# Patient Record
Sex: Male | Born: 1985 | Race: White | Hispanic: No | Marital: Married | Smoking: Former smoker
Health system: Southern US, Community
[De-identification: ages and names within clinical notes are randomized; demographics above are authoritative.]

---
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
## 2009-05-25 ENCOUNTER — Emergency Department (HOSPITAL_COMMUNITY): Admission: EM | Admit: 2009-05-25 | Discharge: 2009-05-25 | Payer: Self-pay | Admitting: Emergency Medicine

## 2013-12-23 ENCOUNTER — Emergency Department (INDEPENDENT_AMBULATORY_CARE_PROVIDER_SITE_OTHER)
Admission: EM | Admit: 2013-12-23 | Discharge: 2013-12-23 | Disposition: A | Discharge status: Home / Self Care | Payer: Self-pay | Source: Home / Self Care | Attending: Emergency Medicine | Admitting: Emergency Medicine

## 2013-12-23 ENCOUNTER — Encounter (HOSPITAL_COMMUNITY): Payer: Self-pay | Admitting: Emergency Medicine

## 2013-12-23 DIAGNOSIS — T148 Other injury of unspecified body region: Secondary | ICD-10-CM

## 2013-12-23 DIAGNOSIS — Z23 Encounter for immunization: Secondary | ICD-10-CM

## 2013-12-23 DIAGNOSIS — T148XXA Other injury of unspecified body region, initial encounter: Secondary | ICD-10-CM

## 2013-12-23 MED ORDER — LIDOCAINE HCL (PF) 2 % IJ SOLN
INTRAMUSCULAR | Status: AC
  Filled 2013-12-23: qty 2

## 2013-12-23 MED ORDER — TETANUS-DIPHTH-ACELL PERTUSSIS 5-2.5-18.5 LF-MCG/0.5 IM SUSP
0.5000 mL | Freq: Once | INTRAMUSCULAR | Status: AC
  Administered 2013-12-23: 0.5 mL via INTRAMUSCULAR

## 2013-12-23 MED ORDER — TETANUS-DIPHTH-ACELL PERTUSSIS 5-2.5-18.5 LF-MCG/0.5 IM SUSP
INTRAMUSCULAR | Status: AC
  Filled 2013-12-23: qty 0.5

## 2013-12-23 NOTE — ED Provider Notes (Addendum)
CSN: [id number]     Arrival date & time 12/23/13  1730 History   First MD Initiated Contact with Patient 12/23/13 1813     Chief Complaint  Patient presents with  . Hand Pain   (Consider location/radiation/quality/duration/timing/severity/associated sxs/prior Treatment) HPI  He is a 28 year old man here for evaluation of right index finger splinter. He states he was clearing some brush on Saturday when he got the splinter. He described it as a thorn. Over the weekend he developed some swelling in the proximal index finger at the site of the splinter. He denies any redness or drainage. He has taken Naprosyn once.  History reviewed. No pertinent past medical history. History reviewed. No pertinent past surgical history. No family history on file. History  Substance Use Topics  . Smoking status: Never Smoker   . Smokeless tobacco: Not on file  . Alcohol Use: No    Review of Systems As in history of present illness. Allergies  Review of patient's allergies indicates no known allergies.  Home Medications   Prior to Admission medications   Medication Sig Start Date End Date Taking? Authorizing Provider  ibuprofen (ADVIL,MOTRIN) 200 MG tablet Take 200 mg by mouth every 6 (six) hours as needed.   Yes Historical Provider, MD   BP 134/99 mmHg  Pulse 75  Temp(Src) 98.4 F (36.9 C) (Oral)  Resp 18  SpO2 100% Physical Exam  Constitutional: He is oriented to person, place, and time. He appears well-developed and well-nourished. No distress.  Cardiovascular: Normal rate.   Pulmonary/Chest: Effort normal.  Neurological: He is alert and oriented to person, place, and time.  Skin:  Splinter on palmar index finger at PIP joint.  Mild swelling of the area without erythema or warmth.    ED Course  FOREIGN BODY REMOVAL Date/Time: 12/23/2013 6:42 PM Performed by: Charm RingsHONIG, Venda Dice J Authorized by: Charm RingsHONIG, Joyce Leckey J Consent: Verbal consent obtained. Risks and benefits: risks, benefits and  alternatives were discussed Consent given by: patient Patient understanding: patient states understanding of the procedure being performed Patient identity confirmed: verbally with patient Time out: Immediately prior to procedure a "time out" was called to verify the correct patient, procedure, equipment, support staff and site/side marked as required. Body area: skin General location: upper extremity Location details: right index finger Anesthesia: local infiltration Local anesthetic: lidocaine 2% without epinephrine Anesthetic total: 0.5 ml Patient cooperative: yes Foreign bodies recovered: No obvious splinter removed, but shadow under the skin is gone. Post-procedure assessment: foreign body removed Patient tolerance: Patient tolerated the procedure well with no immediate complications   (including critical care time) Labs Review Labs Reviewed - No data to display  Imaging Review No results found.   MDM   1. Splinter in skin    While no clear object was removed, I no longer see the shadow under his skin. Recommended Naprosyn and icing to get rid of the swelling. Okay to apply Neosporin for the next few days. Reviewed signs of infection.  TDaP given.  Charm RingsErin J Salomon Ganser, MD 12/23/13 [id number]  Charm RingsErin J Chivonne Rascon, MD 12/24/13 [phone]

## 2013-12-23 NOTE — ED Notes (Signed)
Finger pain, right.  Patient reports collecting old/dried brush and a thorn pierced right index finger.  Finger painful and swollen

## 2013-12-23 NOTE — Discharge Instructions (Signed)
Take Naprosyn twice a day for the next 5 days. Ice the finger 2-3 times a day.  Wood Splinters Wood splinters need to be removed because they can cause skin irritation and infection. If they are close to the surface, splinters can usually be removed easily. Deep splinters may be hard to locate and need treatment by a surgeon. HOME CARE INSTRUCTIONS   Keep the injured area high up (elevated).  Use the injured area as little as possible.  Keep the injured area clean and dry. Follow any directions from your caregiver.  Keep any follow-up or wound check appointments. You might need a tetanus shot now if:  You have no idea when you had the last one.  You have never had a tetanus shot before.  The injured area had dirt in it. Even if you have already removed the splinter, call your caregiver to get a tetanus shot if you need one.  If you need a tetanus shot, and you decide not to get one, there is a rare chance of getting tetanus. Sickness from tetanus can be serious. If you did get a tetanus shot, your arm may swell, get red and warm to the touch at the shot site. This is common and not a problem. SEEK MEDICAL CARE IF:   A splinter has been removed, but you are not better in a day or two.  You develop a temperature.  Signs of infection develop such as:  Redness, swelling or pus around the wound.  Red streaks spreading back from your wound towards your body. Document Released: 03/03/2004 Document Revised: 06/10/2013 Document Reviewed: 02/04/2008 [facility] Patient Information 2015 [place], [place]. This information is not intended to replace advice given to you by your health care provider. Make sure you discuss any questions you have with your health care provider.

## 2016-08-31 ENCOUNTER — Emergency Department (HOSPITAL_COMMUNITY)
Admission: EM | Admit: 2016-08-31 | Discharge: 2016-08-31 | Disposition: A | Discharge status: Home / Self Care | Payer: Self-pay | Attending: Emergency Medicine | Admitting: Emergency Medicine

## 2016-08-31 ENCOUNTER — Encounter (HOSPITAL_COMMUNITY): Payer: Self-pay | Admitting: Emergency Medicine

## 2016-08-31 DIAGNOSIS — L0291 Cutaneous abscess, unspecified: Secondary | ICD-10-CM

## 2016-08-31 DIAGNOSIS — Z87891 Personal history of nicotine dependence: Secondary | ICD-10-CM | POA: Insufficient documentation

## 2016-08-31 DIAGNOSIS — L0231 Cutaneous abscess of buttock: Secondary | ICD-10-CM | POA: Insufficient documentation

## 2016-08-31 MED ORDER — LIDOCAINE HCL 1 % IJ SOLN
INTRAMUSCULAR | Status: AC
  Administered 2016-08-31: 15:00:00
  Filled 2016-08-31: qty 20

## 2016-08-31 MED ORDER — ACETAMINOPHEN 325 MG PO TABS
650.0000 mg | ORAL_TABLET | Freq: Once | ORAL | Status: AC
  Administered 2016-08-31: 650 mg via ORAL
  Filled 2016-08-31: qty 2

## 2016-08-31 MED ORDER — LIDOCAINE HCL (PF) 1 % IJ SOLN: 5.0000 mL | Freq: Once | INTRAMUSCULAR | Status: DC

## 2016-08-31 MED ORDER — CEPHALEXIN 500 MG PO CAPS: 500.0000 mg | ORAL_CAPSULE | Freq: Four times a day (QID) | ORAL | 0 refills | Status: DC

## 2016-08-31 NOTE — ED Triage Notes (Signed)
Pt comes in with complaints of inflammation and infection of a pilonidal cyst. Pt has had it rupture before but states it hasn't healed right since then and believes it is still infected.  Ambulatory. A&O x4 in triage.

## 2016-08-31 NOTE — ED Provider Notes (Signed)
[facility]-EMERGENCY DEPT Provider Note   CSN: [id number] Arrival date & time: 08/31/16  1323     History   Chief Complaint Chief Complaint  Patient presents with  . Abscess    HPI Justin Pruitt is a 31 y.o. male who presents with 1 week of worsening abscess to the left gluteal area. Patient reports a had a history of abscesses or before. He reports this most recent one was a few weeks ago. He attempted to break it open himself and had some drainage but reports that the area returned again. He reports redness and pain to the left gluteal region. He denies any fevers, chills.   The history is provided by the patient.    History reviewed. No pertinent past medical history.  There are no active problems to display for this patient.   History reviewed. No pertinent surgical history.     Home Medications    Prior to Admission medications   Medication Sig Start Date End Date Taking? Authorizing Provider  cephALEXin (KEFLEX) 500 MG capsule Take 1 capsule (500 mg total) by mouth 4 (four) times daily. 08/31/16   Maxwell CaulLayden, Nathaniel Yaden A, PA-C  ibuprofen (ADVIL,MOTRIN) 200 MG tablet Take 200 mg by mouth every 6 (six) hours as needed.    [provider]    Family History No family history on file.  Social History Social History  Substance Use Topics  . Smoking status: Former Games developermoker  . Smokeless tobacco: Never Used  . Alcohol use No     Allergies   Codeine   Review of Systems Review of Systems  Constitutional: Negative for fever.  Skin: Positive for color change and wound.     Physical Exam Updated Vital Signs BP (!) 146/101 (BP Location: Left Arm)   Pulse 98   Temp 98.2 F (36.8 C) (Oral)   Resp 16   Ht 6\' 1"  (1.854 m)   SpO2 97%   Physical Exam  Constitutional: He appears well-developed and well-nourished.  Sitting comfortably on examination table  HENT:  Head: Normocephalic and atraumatic.  Eyes: Conjunctivae and EOM are normal. Right eye exhibits  no discharge. Left eye exhibits no discharge. No scleral icterus.  Pulmonary/Chest: Effort normal.  Neurological: He is alert.  Skin: Skin is warm and dry. Capillary refill takes less than 2 seconds.     Small 1.5 cm area of erythema with central fluctuance to the medial left gluteal region. It does not extend into the gluteal cleft. There is mild erythema that extends to the lateral gluteal region.  Psychiatric: He has a normal mood and affect. His speech is normal and behavior is normal.  Nursing note and vitals reviewed.   ED Treatments / Results  Labs (all labs ordered are listed, but only abnormal results are displayed) Labs Reviewed - No data to display  EKG  EKG Interpretation None       Radiology No results found.  Procedures .Marland Kitchen.Incision and Drainage Date/Time: 08/31/2016 3:03 PM Performed by: Graciella FreerLAYDEN, Ziyanna Tolin A Authorized by: Laurence SpatesLITTLE, RACHEL MORGAN   Consent:    Consent obtained:  Verbal   Consent given by:  Patient   Risks discussed:  Bleeding and incomplete drainage   Alternatives discussed:  No treatment Location:    Type:  Abscess   Location:  Anogenital   Anogenital location: left gluteal area. Anesthesia (see MAR for exact dosages):    Anesthesia method:  Local infiltration   Local anesthetic:  Lidocaine 1% w/o epi Procedure details:  Incision types:  Single straight   Scalpel blade:  11   Wound management:  Probed and deloculated   Drainage:  Purulent and bloody   Drainage amount:  Scant   Wound treatment:  Wound left open Post-procedure details:    Patient tolerance of procedure:  Tolerated well, no immediate complications    (including critical care time)  Medications Ordered in ED Medications  lidocaine (XYLOCAINE) 1 % (with pres) injection (  Given by Other 08/31/16 1456)  acetaminophen (TYLENOL) tablet 650 mg (650 mg Oral Given 08/31/16 1521)     Initial Impression / Assessment and Plan / ED Course  I have reviewed the triage vital  signs and the nursing notes.  Pertinent labs & imaging results that were available during my care of the patient were reviewed by me and considered in my medical decision making (see chart for details).     31 yo M who presents with skin abscess x 1 week. Patient is afebrile, non-toxic appearing, sitting comfortably on examination table. Vital signs reviewed and stable. Physical exam shows very small abscess with central fluctuance. Abscess is small, approximately 1cm. It does not spread into the gluteal cleft. No evidence of pilonidal cyst. History/physical exam are not concerning for peritonsillar abscess. Explained to patient that given the size of the abscess, will likely not have a lot of drainage but patient would rather go ahead and have it drained to provide relief. Since there is some fluctuance and surrounding erythema that extends to the lateral gluteal area, I feel that this is reasonable.  Abscess drained as documented above. Abscess was not large enough to warrant packing or drain,  wound recheck in 2 days. Encouraged home warm soaks and flushing.  Mild signs of cellulitis is surrounding skin.. Return precautions discussed. Patient expresses understanding and agreement to plan.    Final Clinical Impressions(s) / ED Diagnoses   Final diagnoses:  Abscess    New Prescriptions Discharge Medication List as of 08/31/2016  2:52 PM    START taking these medications   Details  cephALEXin (KEFLEX) 500 MG capsule Take 1 capsule (500 mg total) by mouth 4 (four) times daily., Starting Wed 08/31/2016, Print         Maxwell CaulLayden, Lulani Bour A, PA-C 08/31/16 2106    Little, Ambrose Finlandachel Morgan, MD 08/31/16 2131

## 2016-08-31 NOTE — Discharge Instructions (Signed)
You can take Tylenol or Ibuprofen as directed for pain.  As we discussed, use warm soaks at home to help continue to express drainage. Continue to keep the wound open.   Keep the wound clean and dry. You can gently wash with soap and water but make sure that the wound is completely dry before putting any dressing on it.   Follow-up with the referred clinic or the return to the ED in 2 days for a wound recheck .  Return sooner for any worsening redness, swelling to the area, drainage, fever, or any other worsening or concerning symptoms.

## 2018-10-09 ENCOUNTER — Emergency Department (HOSPITAL_COMMUNITY): Payer: Self-pay

## 2018-10-09 ENCOUNTER — Encounter (HOSPITAL_COMMUNITY): Payer: Self-pay

## 2018-10-09 ENCOUNTER — Emergency Department (HOSPITAL_COMMUNITY)
Admission: EM | Admit: 2018-10-09 | Discharge: 2018-10-09 | Disposition: A | Discharge status: Home / Self Care | Payer: Self-pay | Attending: Emergency Medicine | Admitting: Emergency Medicine

## 2018-10-09 ENCOUNTER — Other Ambulatory Visit: Payer: Self-pay

## 2018-10-09 DIAGNOSIS — Z87891 Personal history of nicotine dependence: Secondary | ICD-10-CM | POA: Insufficient documentation

## 2018-10-09 DIAGNOSIS — Z79899 Other long term (current) drug therapy: Secondary | ICD-10-CM | POA: Insufficient documentation

## 2018-10-09 DIAGNOSIS — R0789 Other chest pain: Secondary | ICD-10-CM | POA: Insufficient documentation

## 2018-10-09 LAB — BASIC METABOLIC PANEL
Anion gap: 12 (ref 5–15)
BUN: 10 mg/dL (ref 6–20)
CO2: 25 mmol/L (ref 22–32)
Calcium: 9.4 mg/dL (ref 8.9–10.3)
Chloride: 103 mmol/L (ref 98–111)
Creatinine, Ser: 1.14 mg/dL (ref 0.61–1.24)
GFR calc Af Amer: >60 mL/min (ref >60)
GFR calc non Af Amer: >60 mL/min (ref >60)
Glucose, Bld: 114 mg/dL — ABNORMAL HIGH (ref 70–99)
Potassium: 3.7 mmol/L (ref 3.5–5.1)
Sodium: 140 mmol/L (ref 135–145)

## 2018-10-09 LAB — CBC
HCT: 55 % — ABNORMAL HIGH (ref 39.0–52.0)
Hemoglobin: 18.3 g/dL — ABNORMAL HIGH (ref 13.0–17.0)
MCH: 30 pg (ref 26.0–34.0)
MCHC: 33.3 g/dL (ref 30.0–36.0)
MCV: 90.2 fL (ref 80.0–100.0)
Platelets: 288 10*3/uL (ref 150–400)
RBC: 6.1 MIL/uL — ABNORMAL HIGH (ref 4.22–5.81)
RDW: 12.6 % (ref 11.5–15.5)
WBC: 11.3 10*3/uL — ABNORMAL HIGH (ref 4.0–10.5)
nRBC: 0 % (ref 0.0–0.2)

## 2018-10-09 LAB — TROPONIN I (HIGH SENSITIVITY): Troponin I (High Sensitivity): <2 ng/L (ref <18)

## 2018-10-09 MED ORDER — NAPROXEN 500 MG PO TABS: 500.0000 mg | ORAL_TABLET | Freq: Two times a day (BID) | ORAL | 0 refills | Status: AC

## 2018-10-09 MED ORDER — NAPROXEN 250 MG PO TABS
500.0000 mg | ORAL_TABLET | Freq: Once | ORAL | Status: AC
  Administered 2018-10-09: 500 mg via ORAL
  Filled 2018-10-09: qty 2

## 2018-10-09 MED ORDER — ASPIRIN 81 MG PO CHEW
324.0000 mg | CHEWABLE_TABLET | Freq: Once | ORAL | Status: AC
  Administered 2018-10-09: 324 mg via ORAL
  Filled 2018-10-09: qty 4

## 2018-10-09 NOTE — ED Provider Notes (Signed)
[facility] EMERGENCY DEPARTMENT Provider Note   CSN: [id number] Arrival date & time: 10/09/18  1143     History   Chief Complaint Chief Complaint  Patient presents with  . Chest Pain    HPI Justin Pruitt is a 33 y.o. male with no significant past medical history, current smoker but states quitting today, presenting with a 2+ day history of left chest pain in association with shortness of breath which is triggered when supine and improves with sitting upright and ambulation.  He describes a gradual progression of pain which was severe last night, stabbing pain which was somewhat reproducible and radiated into his left shoulder.  He took 4 baby aspirin and his symptoms resolved enough to be able to sleep. He woke this am with mild left upper chest pressure (rubbing his upper lateral chest/pectoralis).  He denies diaphoresis, n/v, palpitations, fevers, chills, cough, denies recent injuries.  He has had no tx this am.  No sig family hx of cardiac disease at early age. No extremity pain or swelling. Denies risk for dvts.     The history is provided by the patient.    History reviewed. No pertinent past medical history.  There are no active problems to display for this patient.   History reviewed. No pertinent surgical history.      Home Medications    Prior to Admission medications   Medication Sig Start Date End Date Taking? Authorizing Provider  ibuprofen (ADVIL,MOTRIN) 200 MG tablet Take 200 mg by mouth every 6 (six) hours as needed.    [provider]  naproxen (NAPROSYN) 500 MG tablet Take 1 tablet (500 mg total) by mouth 2 (two) times daily. 10/09/18   Evalee Jefferson, PA-C    Family History No family history on file.  Social History Social History   Tobacco Use  . Smoking status: Former Research scientist (life sciences)  . Smokeless tobacco: Never Used  Substance Use Topics  . Alcohol use: No  . Drug use: No     Allergies   Codeine   Review of Systems Review of Systems   Constitutional: Negative for fever.  HENT: Negative for congestion and sore throat.   Eyes: Negative.   Respiratory: Positive for shortness of breath. Negative for chest tightness.   Cardiovascular: Positive for chest pain.  Gastrointestinal: Negative for abdominal pain, nausea and vomiting.  Genitourinary: Negative.   Musculoskeletal: Negative for arthralgias, joint swelling and neck pain.  Skin: Negative.  Negative for rash and wound.  Neurological: Negative for dizziness, weakness, light-headedness, numbness and headaches.  Psychiatric/Behavioral: Negative.      Physical Exam Updated Vital Signs BP (!) 116/94   Pulse 75   Temp 97.8 F (36.6 C) (Oral)   Resp 19   Ht 6\' 1"  (1.854 m)   Wt 90.7 kg   SpO2 96%   BMI 26.39 kg/m   Physical Exam Vitals signs and nursing note reviewed.  Constitutional:      Appearance: He is well-developed.  HENT:     Head: Normocephalic and atraumatic.  Eyes:     Conjunctiva/sclera: Conjunctivae normal.  Neck:     Musculoskeletal: Normal range of motion.  Cardiovascular:     Rate and Rhythm: Normal rate and regular rhythm.  No extrasystoles are present.    Chest Wall: PMI is not displaced. No thrill.     Heart sounds: Normal heart sounds. No murmur. No friction rub.  Pulmonary:     Effort: Pulmonary effort is normal.     Breath sounds:  Normal breath sounds. No wheezing.  Chest:     Breasts:        Left: Tenderness present.       Comments: Mild ttp left upper lateral chest wall. No crepitus or edema.  Abdominal:     General: Bowel sounds are normal.     Palpations: Abdomen is soft.     Tenderness: There is no abdominal tenderness.  Musculoskeletal: Normal range of motion.  Skin:    General: Skin is warm and dry.  Neurological:     Mental Status: He is alert.      ED Treatments / Results  Labs (all labs ordered are listed, but only abnormal results are displayed) Labs Reviewed  BASIC METABOLIC PANEL - Abnormal; Notable for  the following components:      Result Value   Glucose, Bld 114 (*)    All other components within normal limits  CBC - Abnormal; Notable for the following components:   WBC 11.3 (*)    RBC 6.10 (*)    Hemoglobin 18.3 (*)    HCT 55.0 (*)    All other components within normal limits  TROPONIN I (HIGH SENSITIVITY)    EKG EKG Interpretation  Date/Time:  Tuesday October 09 2018 11:54:54 EDT Ventricular Rate:  92 PR Interval:    QRS Duration: 97 QT Interval:  358 QTC Calculation: 443 R Axis:   74 Text Interpretation:  Sinus rhythm No old tracing to compare Normal ECG Confirmed by Eber HongMiller, Brian ([id number]) on 10/09/2018 12:32:41 PM   Radiology Dg Chest 2 View  Result Date: 10/09/2018 CLINICAL DATA:  Chest pain EXAM: CHEST - 2 VIEW COMPARISON:  None. FINDINGS: The heart size and mediastinal contours are within normal limits. Both lungs are clear. The visualized skeletal structures are unremarkable. IMPRESSION: No acute abnormality of the lungs. Electronically Signed   By: Lauralyn PrimesAlex  Bibbey M.D.   On: 10/09/2018 12:28    Procedures Procedures (including critical care time)  Medications Ordered in ED Medications  naproxen (NAPROSYN) tablet 500 mg (has no administration in time range)  aspirin chewable tablet 324 mg (324 mg Oral Given 10/09/18 1226)     Initial Impression / Assessment and Plan / ED Course  I have reviewed the triage vital signs and the nursing notes.  Pertinent labs & imaging results that were available during my care of the patient were reviewed by me and considered in my medical decision making (see chart for details).        Pt with positional left and shoulder pain suggesting pleuritic vs musculoskeletal source.  Labs and imaging, ekg reviewed and interpreted with no red flag findings.  Elevation in hgb not inconsistent with smoking hx.  No risk factors for dvt/PE and no exam findings to suggest this possibility. No tachypnea or tachycardia.  Troponin negative x 1  with sx 2+ days, delta trop not indicated.  Plan nsaids, heat tx.  Return precautions discussed. Referrals suggested for establishment of pcp.  Final Clinical Impressions(s) / ED Diagnoses   Final diagnoses:  Atypical chest pain    ED Discharge Orders         Ordered    naproxen (NAPROSYN) 500 MG tablet  2 times daily     10/09/18 1346           Burgess Amordol, Valetta Mulroy, PA-C 10/09/18 1350    Eber HongMiller, Brian, MD 10/10/18 [phone]

## 2018-10-09 NOTE — ED Notes (Signed)
EDP at bedside updating patient. 

## 2018-10-09 NOTE — ED Triage Notes (Signed)
Pt is having left chest pain that radiates into his shoulder. Describes pain as stabbing and worse when he lays down. Took 4 baby aspirin yesterday with some relief. No cardiac history.

## 2018-10-09 NOTE — Discharge Instructions (Addendum)
Your exam, lab tests, chest xray and ekg today are all stable with no abnormal findings.  The nature of your symptoms suggest an inflammatory condition, possibly of your chest wall (ribs, cartilage, muscles) and/or lining of your lung (a condition called pleurisy). This is treated with anti inflammatory medicine which has been prescribed as well as application of a heating pad several times daily for comfort.

## 2020-09-09 ENCOUNTER — Ambulatory Visit
Admission: EM | Admit: 2020-09-09 | Discharge: 2020-09-09 | Disposition: A | Discharge status: Home / Self Care | Payer: Self-pay | Attending: Family Medicine | Admitting: Family Medicine

## 2020-09-09 ENCOUNTER — Encounter: Payer: Self-pay | Admitting: Emergency Medicine

## 2020-09-09 DIAGNOSIS — L720 Epidermal cyst: Secondary | ICD-10-CM

## 2020-09-09 MED ORDER — DOXYCYCLINE HYCLATE 100 MG PO CAPS: 100.0000 mg | ORAL_CAPSULE | Freq: Two times a day (BID) | ORAL | 0 refills | Status: AC

## 2020-09-09 NOTE — ED Provider Notes (Signed)
[facility]   [id number] 09/09/20 Arrival Time: 1655  ASSESSMENT & PLAN:  1. Epidermoid cyst of skin of back     Incision and Drainage Procedure Note  Anesthesia: 2% plain lidocaine  Procedure Details  The procedure, risks and complications have been discussed in detail (including, but not limited to pain and bleeding) with the patient.  The skin induration was prepped and draped in the usual fashion. After adequate local anesthesia, I&D with a #11 blade was performed on the  #1) mid upper back #2) right upper back. Both with copious cystic and purulent debris. Moderate bleeding; controlled.  EBL: minimal Drains: none Packing: n/a Condition: Tolerated procedure well Complications: none.  Begin: Meds ordered this encounter  Medications   doxycycline (VIBRAMYCIN) 100 MG capsule    Sig: Take 1 capsule (100 mg total) by mouth 2 (two) times daily.    Dispense:  14 capsule    Refill:  0   Wound care instructions discussed and given in written format. To return in 48 hours for wound check.  Finish all antibiotics. OTC analgesics as needed.   Follow-up Information     [facility].   Specialty: Urgent Care Why: If worsening or failing to improve as anticipated. Contact information: [address] [place] [address] [phone]                Reviewed expectations re: course of current medical issues. Questions answered. Outlined signs and symptoms indicating need for more acute intervention. Patient verbalized understanding. After Visit Summary given.   SUBJECTIVE:  Justin Pruitt is a 35 y.o. male who presents with question of infected cysts of upper back. Cysts present for quite awhile. After trip to beach both have enlarged and become painful. No drainage or bleeding. Afebrile. No tx PTA.   OBJECTIVE:  Vitals:   09/09/20 1806  BP: 129/80  Pulse: 80  Resp: 16  Temp: 98.4 F  (36.9 C)  TempSrc: Oral  SpO2: 99%     General appearance: alert; no distress Upper back: two approx 1.5 cm indurations of his upper back; both very tender and with overlying erythema; no active drainage or bleeding Psychological: alert and cooperative; normal mood and affect  Allergies  Allergen Reactions   Amoxil [Amoxicillin]    Codeine Nausea And Vomiting    History reviewed. No pertinent past medical history. Social History   Socioeconomic History   Marital status: Married    Spouse name: Not on file   Number of children: Not on file   Years of education: Not on file   Highest education level: Not on file  Occupational History   Not on file  Tobacco Use   Smoking status: Former   Smokeless tobacco: Never  Substance and Sexual Activity   Alcohol use: No   Drug use: No   Sexual activity: Not on file  Other Topics Concern   Not on file  Social History Narrative   Not on file   Social Determinants of Health   Financial Resource Strain: Not on file  Food Insecurity: Not on file  Transportation Needs: Not on file  Physical Activity: Not on file  Stress: Not on file  Social Connections: Not on file   Family History  Problem Relation Age of Onset   Diabetes Mother    Irritable bowel syndrome Father    History reviewed. No pertinent surgical history.          Mardella Layman,  MD 09/10/20 1341

## 2020-09-09 NOTE — ED Triage Notes (Signed)
2 abscess on back areas have been there for a while, but recently have become larger.

## 2021-03-21 IMAGING — DX DG CHEST 2V
2 series · 2 of 2 positions shown · non-contrast
Comparison: None.

CLINICAL DATA: Chest pain

EXAM:
CHEST - 2 VIEW

[chest pa]
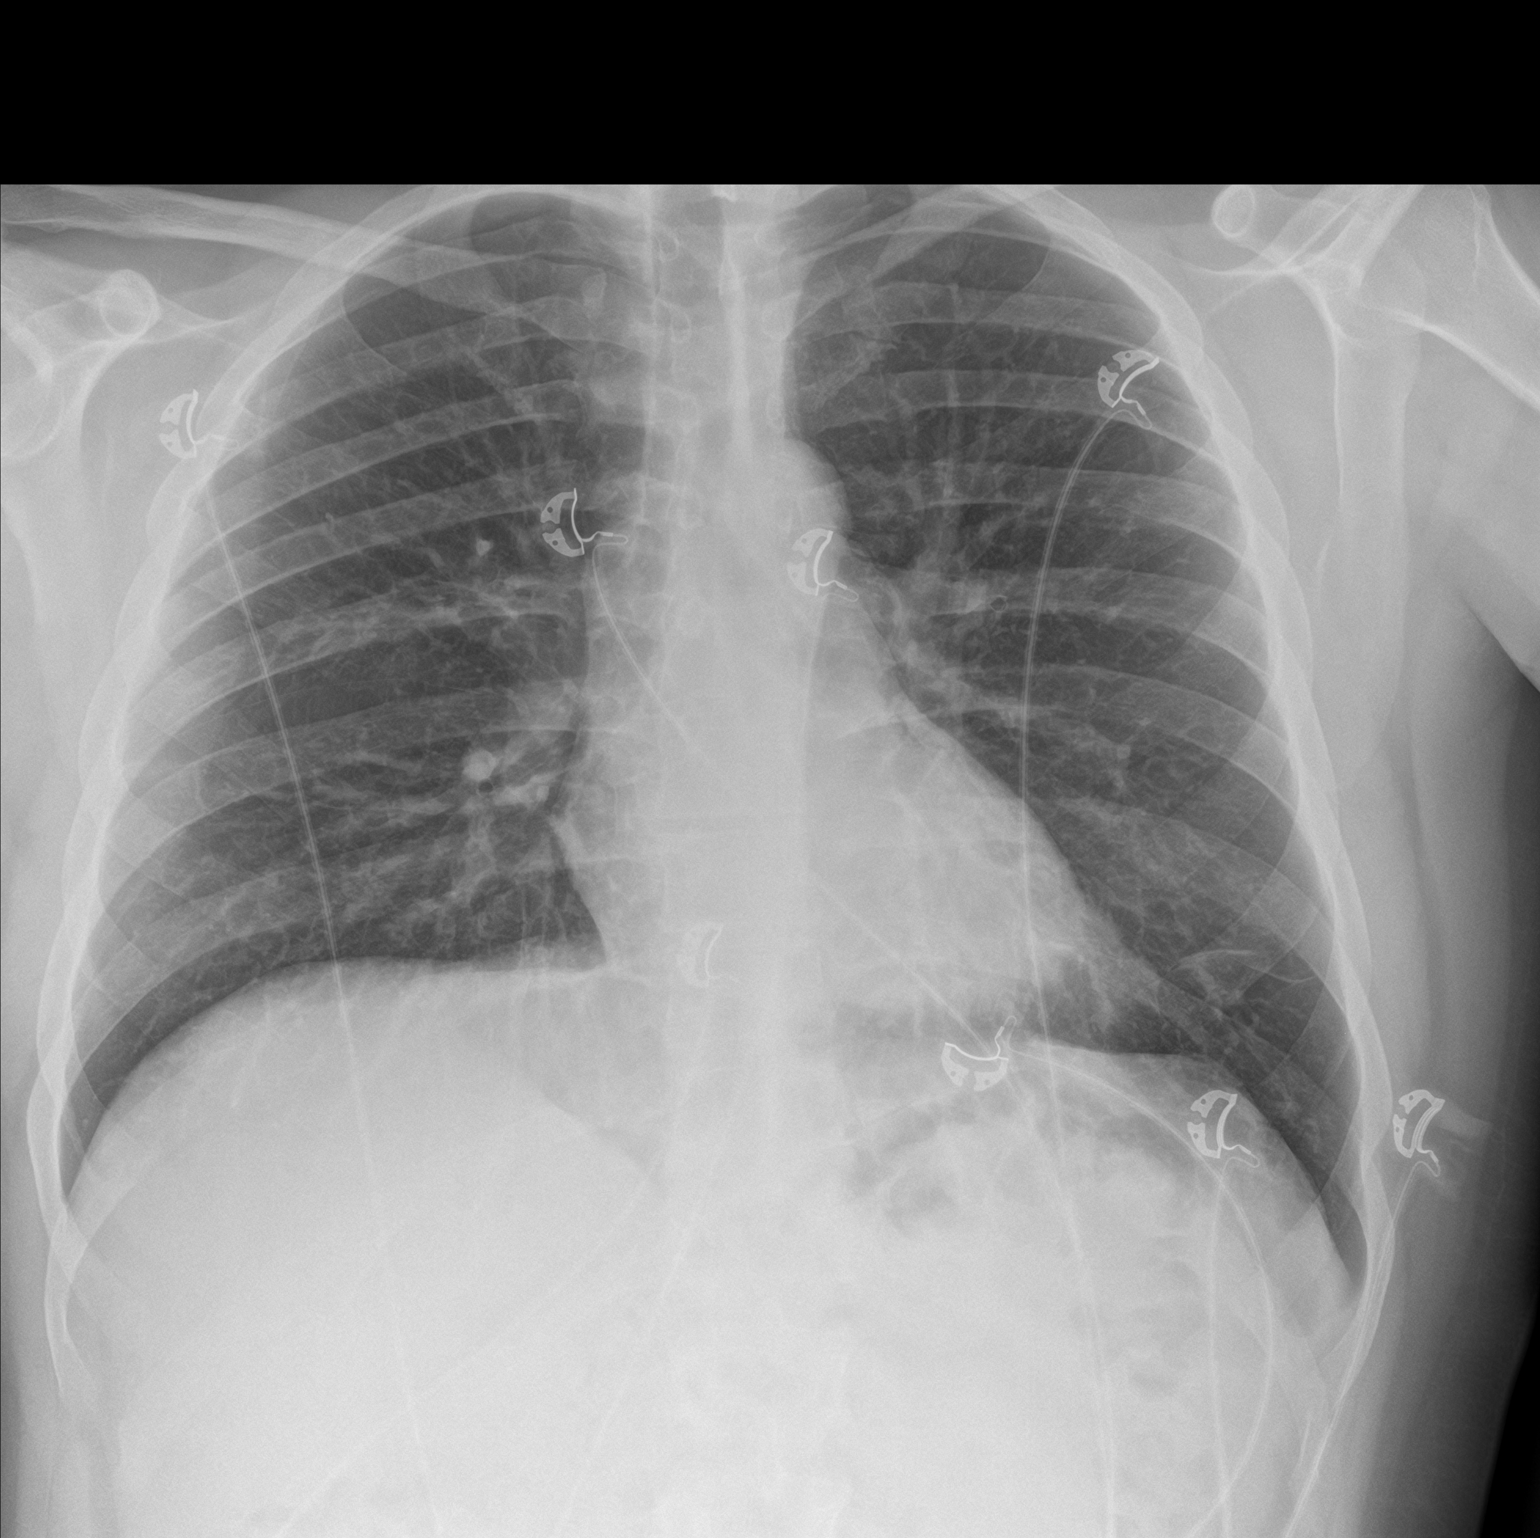

[chest lat]
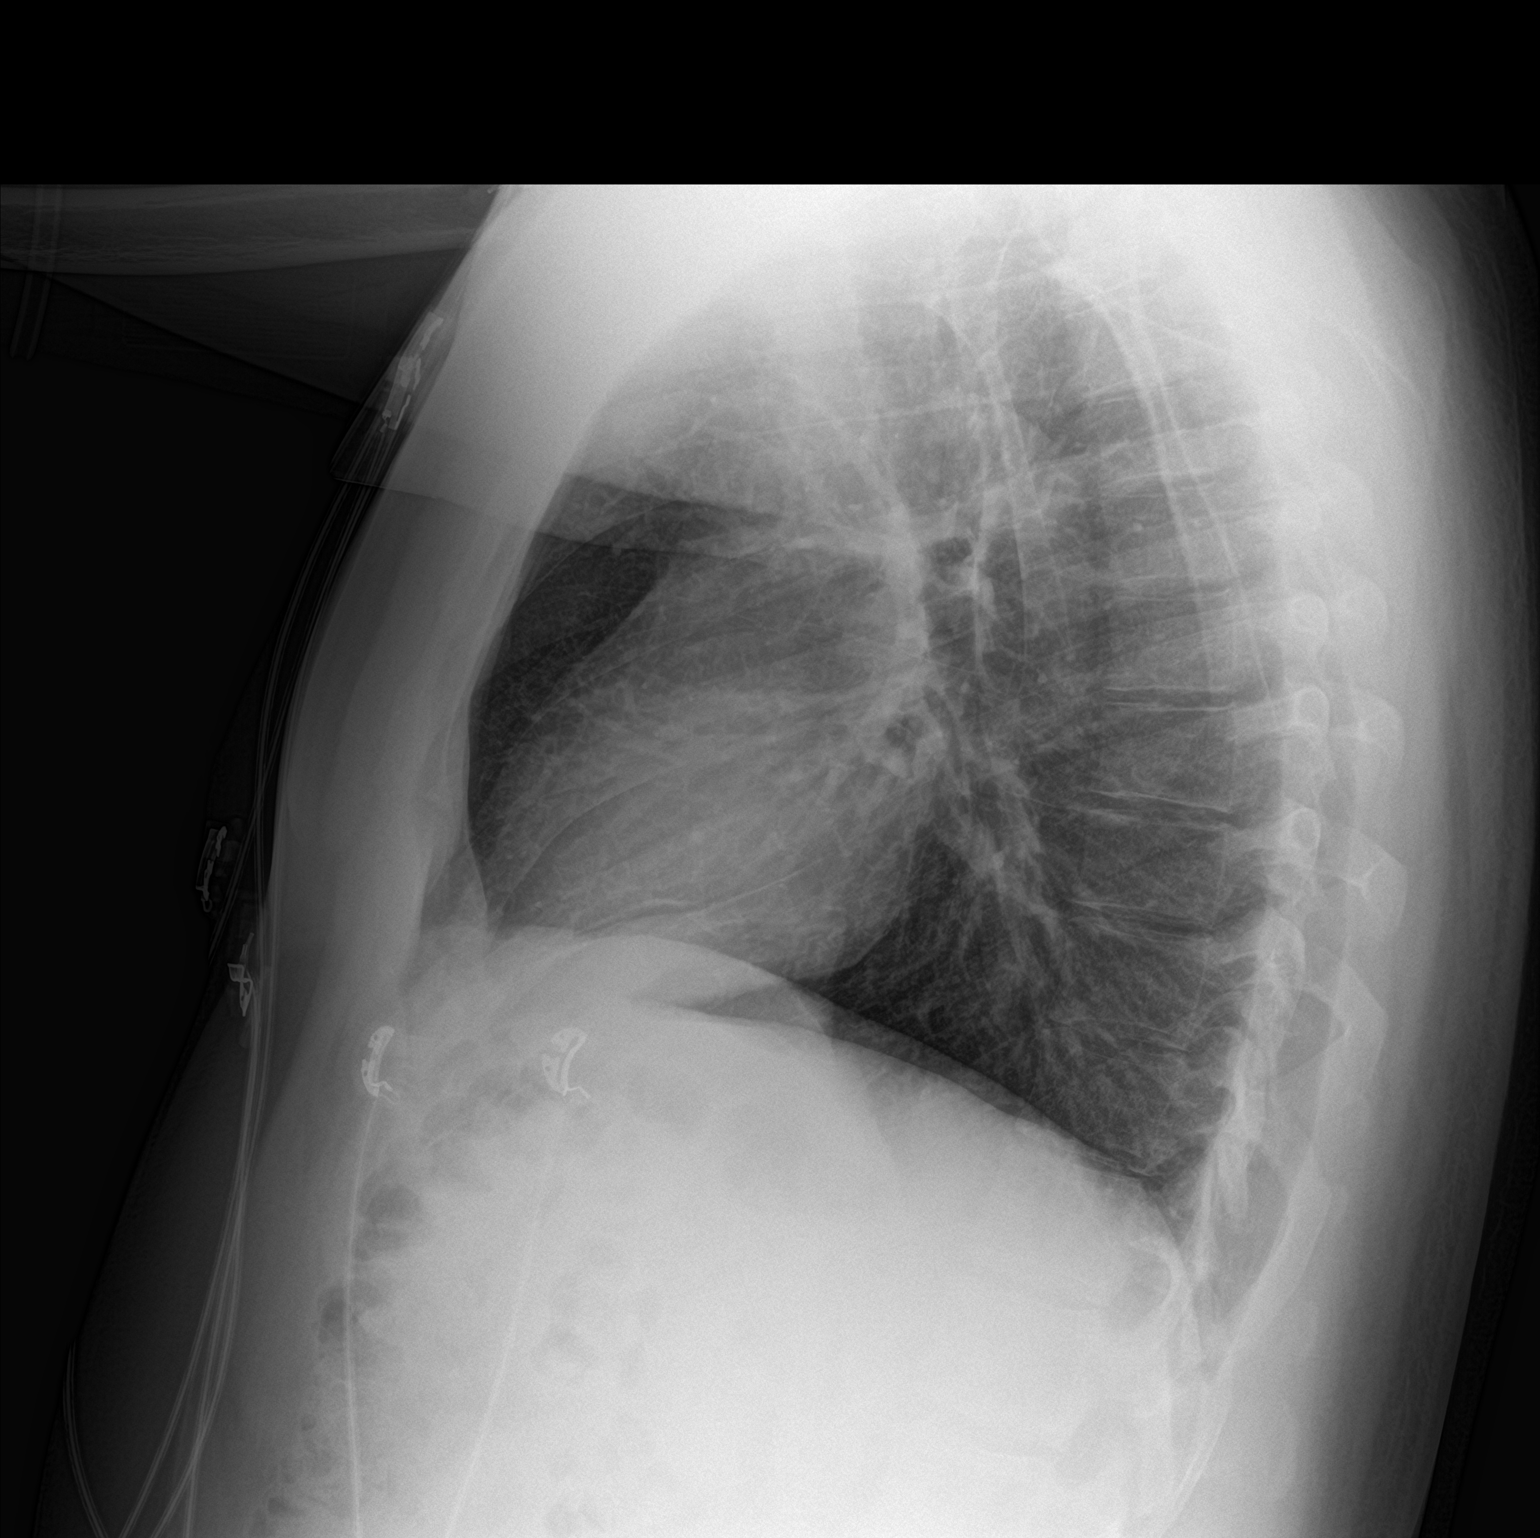

[2 of 2 positions shown; findings below may reference images not displayed]

FINDINGS: The heart size and mediastinal contours are within normal limits.
Both lungs are clear. The visualized skeletal structures are
unremarkable.
IMPRESSION: No acute abnormality of the lungs.

## 2023-12-18 ENCOUNTER — Encounter (HOSPITAL_COMMUNITY): Payer: Self-pay | Admitting: Emergency Medicine

## 2023-12-18 ENCOUNTER — Other Ambulatory Visit: Payer: Self-pay

## 2023-12-18 ENCOUNTER — Emergency Department (HOSPITAL_COMMUNITY)
Admission: EM | Admit: 2023-12-18 | Discharge: 2023-12-18 | Disposition: A | Discharge status: Home / Self Care | Attending: Emergency Medicine | Admitting: Emergency Medicine

## 2023-12-18 ENCOUNTER — Emergency Department (HOSPITAL_COMMUNITY)

## 2023-12-18 DIAGNOSIS — S81812A Laceration without foreign body, left lower leg, initial encounter: Secondary | ICD-10-CM | POA: Insufficient documentation

## 2023-12-18 DIAGNOSIS — W268XXA Contact with other sharp object(s), not elsewhere classified, initial encounter: Secondary | ICD-10-CM | POA: Insufficient documentation

## 2023-12-18 DIAGNOSIS — Z23 Encounter for immunization: Secondary | ICD-10-CM | POA: Insufficient documentation

## 2023-12-18 DIAGNOSIS — S8992XA Unspecified injury of left lower leg, initial encounter: Secondary | ICD-10-CM | POA: Diagnosis present

## 2023-12-18 MED ORDER — TETANUS-DIPHTH-ACELL PERTUSSIS 5-2-15.5 LF-MCG/0.5 IM SUSP
0.5000 mL | Freq: Once | INTRAMUSCULAR | Status: AC
  Administered 2023-12-18: 0.5 mL via INTRAMUSCULAR
  Filled 2023-12-18: qty 0.5

## 2023-12-18 MED ORDER — LIDOCAINE-EPINEPHRINE (PF) 2 %-1:200000 IJ SOLN
10.0000 mL | Freq: Once | INTRAMUSCULAR | Status: AC
  Administered 2023-12-18: 10 mL
  Filled 2023-12-18: qty 20

## 2023-12-18 NOTE — ED Triage Notes (Signed)
 Pt presents with laceration to left lower leg, pt states he was chopping herbs in his garden and accidentally cut himself with a hatchet

## 2023-12-18 NOTE — Discharge Instructions (Signed)
 The stitches can come out in 7 to 10 days.

## 2023-12-18 NOTE — ED Provider Notes (Signed)
  [place] EMERGENCY DEPARTMENT AT [facility] Provider Note   CSN: [id number] Arrival date & time: 12/18/23  2046     Patient presents with: Laceration   Justin Pruitt is a 37 y.o. male.    Laceration Patient was using a hatchet to cut some herbs.  Went through the herbs and cut himself on the left anterior lower leg.  States it hit the bone.  States it was very sharp.  Unknown last tetanus.     Prior to Admission medications   Medication Sig Start Date End Date Taking? Authorizing Provider  doxycycline  (VIBRAMYCIN ) 100 MG capsule Take 1 capsule (100 mg total) by mouth 2 (two) times daily. 09/09/20   Rolinda Rogue, MD  ibuprofen (ADVIL,MOTRIN) 200 MG tablet Take 200 mg by mouth every 6 (six) hours as needed.    [provider]  naproxen  (NAPROSYN ) 500 MG tablet Take 1 tablet (500 mg total) by mouth 2 (two) times daily. 10/09/18   Idol, Julie, PA-C    Allergies: Amoxil [amoxicillin] and Codeine    Review of Systems  Updated Vital Signs BP 127/87 (BP Location: Right Arm)   Pulse 97   Temp 98.5 F (36.9 C) (Oral)   Resp 19   Ht 6' 1 (1.854 m)   Wt 108.9 kg   SpO2 100%   BMI 31.66 kg/m   Physical Exam Vitals and nursing note reviewed.  Musculoskeletal:     Comments: Anterior laceration across left lower leg.  Horizontal.  Approximately 2-1/2 cm long.  Able to flex and extend at the ankle.  Neurological:     Mental Status: He is alert.     (all labs ordered are listed, but only abnormal results are displayed) Labs Reviewed - No data to display  EKG: None  Radiology: DG Tibia/Fibula Left Result Date: 12/18/2023 CLINICAL DATA:  Laceration to lower leg EXAM: LEFT TIBIA AND FIBULA - 2 VIEW COMPARISON:  None Available. FINDINGS: There is no evidence of fracture or other focal bone lesions. Soft tissues are unremarkable. IMPRESSION: Negative. Electronically Signed   By: Luke Bun M.D.   On: 12/18/2023 21:23     Procedures   Medications  Ordered in the ED  lidocaine -EPINEPHrine (XYLOCAINE  W/EPI) 2 %-1:200000 (PF) injection 10 mL (10 mLs Infiltration Given 12/18/23 2109)  Tdap (ADACEL) injection 0.5 mL (0.5 mLs Intramuscular Given 12/18/23 2108)                                    Medical Decision Making Amount and/or Complexity of Data Reviewed Radiology: ordered.  Risk Prescription drug management.   Patient laceration of left anterior lower leg.  Hit with a hatchet.  Will get x-ray to evaluate for bony injury.  Will update tetanus.  Will close wound.  Wound closed.  Stitches out in 7 to 10 days.     Final diagnoses:  Laceration of left lower extremity, initial encounter    ED Discharge Orders     None          Patsey Lot, MD 12/18/23 2243

## 2023-12-18 NOTE — ED Provider Notes (Signed)
  Physical Exam  BP 127/87 (BP Location: Right Arm)   Pulse 97   Temp 98.5 F (36.9 C) (Oral)   Resp 19   Ht 6' 1 (1.854 m)   Wt 108.9 kg   SpO2 100%   BMI 31.66 kg/m   Physical Exam  Procedures  .Laceration Repair  Date/Time: 12/18/2023 10:07 PM  Performed by: Willma Duwaine CROME, PA Authorized by: Willma Duwaine CROME, PA   Consent:    Consent obtained:  Verbal   Consent given by:  Patient   Risks, benefits, and alternatives were discussed: yes     Risks discussed:  Infection, pain, need for additional repair, poor cosmetic result, tendon damage, nerve damage, poor wound healing and vascular damage   Alternatives discussed:  No treatment, delayed treatment, observation and referral Universal protocol:    Procedure explained and questions answered to patient or proxy's satisfaction: yes     Relevant documents present and verified: yes     Test results available: no     Imaging studies available: no     Required blood products, implants, devices, and special equipment available: no     Site/side marked: yes     Immediately prior to procedure, a time out was called: yes     Patient identity confirmed:  Verbally with patient, hospital-assigned identification number and arm band Anesthesia:    Anesthesia method:  Local infiltration   Local anesthetic:  Lidocaine  2% WITH epi Laceration details:    Location:  Leg   Leg location:  L lower leg   Length (cm):  5   Depth (mm):  2 Pre-procedure details:    Preparation:  Patient was prepped and draped in usual sterile fashion Exploration:    Hemostasis achieved with:  Epinephrine and direct pressure   Wound extent: fascia not violated, no foreign body, no signs of injury, no nerve damage, no tendon damage, no underlying fracture and no vascular damage     Contaminated: no   Treatment:    Area cleansed with:  Soap and water   Amount of cleaning:  Standard   Irrigation solution:  Sterile water   Irrigation volume:  50cc   Irrigation  method:  Syringe and tap   Debridement:  None Skin repair:    Repair method:  Sutures   Suture size:  5-0   Suture material:  Nylon   Suture technique:  Simple interrupted   Number of sutures:  6 Approximation:    Approximation:  Close Repair type:    Repair type:  Simple Post-procedure details:    Dressing:  Open (no dressing)   Procedure completion:  Tolerated well, no immediate complications   ED Course / MDM    Medical Decision Making Amount and/or Complexity of Data Reviewed Radiology: ordered.  Risk Prescription drug management.   Laceration repair to the distal anterior left lower extremity.     Willma Duwaine CROME, [place] 12/18/23 2215    Patsey Lot, MD 12/18/23 [phone]

## 2023-12-26 ENCOUNTER — Ambulatory Visit: Admission: EM | Admit: 2023-12-26 | Discharge: 2023-12-26 | Disposition: A | Discharge status: Home / Self Care

## 2023-12-26 DIAGNOSIS — Z4802 Encounter for removal of sutures: Secondary | ICD-10-CM

## 2023-12-26 NOTE — ED Provider Notes (Signed)
 [facility]    CSN: [id number] Arrival date & time: 12/26/23  1241      History   Chief Complaint No chief complaint on file.   HPI Justin Pruitt is a 38 y.o. male.   The history is provided by the patient.   Patient presents for suture removal.  Patient states that he had 6 sutures placed to the left lower extremity in the emergency department on 12/18/2023.  He denies swelling, tenderness, foul-smelling drainage from the site, fever, or chills.  Patient states he has been cleaning the area with an antibacterial soap.  No past medical history on file.  There are no active problems to display for this patient.   No past surgical history on file.     Home Medications    Prior to Admission medications   Medication Sig Start Date End Date Taking? Authorizing Provider  doxycycline  (VIBRAMYCIN ) 100 MG capsule Take 1 capsule (100 mg total) by mouth 2 (two) times daily. 09/09/20   Rolinda Rogue, MD  ibuprofen (ADVIL,MOTRIN) 200 MG tablet Take 200 mg by mouth every 6 (six) hours as needed.    [provider]  naproxen  (NAPROSYN ) 500 MG tablet Take 1 tablet (500 mg total) by mouth 2 (two) times daily. 10/09/18   Birdena Clarity, PA-C    Family History Family History  Problem Relation Age of Onset   Diabetes Mother    Irritable bowel syndrome Father     Social History Social History   Tobacco Use   Smoking status: Former   Smokeless tobacco: Never  Vaping Use   Vaping status: Never Used  Substance Use Topics   Alcohol use: No   Drug use: No     Allergies   Amoxil [amoxicillin] and Codeine   Review of Systems Review of Systems Per HPI  Physical Exam Triage Vital Signs ED Triage Vitals  Encounter Vitals Group     BP 12/26/23 1251 124/86     Girls Systolic BP Percentile --      Girls Diastolic BP Percentile --      Boys Systolic BP Percentile --      Boys Diastolic BP Percentile --      Pulse Rate 12/26/23 1251 (!) 56     Resp 12/26/23  1251 20     Temp 12/26/23 1251 98 F (36.7 C)     Temp Source 12/26/23 1251 Oral     SpO2 12/26/23 1251 96 %     Weight --      Height --      Head Circumference --      Peak Flow --      Pain Score 12/26/23 1250 0     Pain Loc --      Pain Education --      Exclude from Growth Chart --    No data found.  Updated Vital Signs BP 124/86 (BP Location: Right Arm)   Pulse (!) 56   Temp 98 F (36.7 C) (Oral)   Resp 20   SpO2 96%   Visual Acuity Right Eye Distance:   Left Eye Distance:   Bilateral Distance:    Right Eye Near:   Left Eye Near:    Bilateral Near:     Physical Exam Vitals and nursing note reviewed.  Constitutional:      General: He is not in acute distress.    Appearance: Normal appearance.  HENT:     Head: Normocephalic.  Eyes:  Extraocular Movements: Extraocular movements intact.     Pupils: Pupils are equal, round, and reactive to light.  Pulmonary:     Effort: Pulmonary effort is normal.  Musculoskeletal:     Cervical back: Normal range of motion.  Skin:    General: Skin is warm and dry.     Findings: Laceration present.      Neurological:     General: No focal deficit present.     Mental Status: He is alert and oriented to person, place, and time.  Psychiatric:        Mood and Affect: Mood normal.        Behavior: Behavior normal.      UC Treatments / Results  Labs (all labs ordered are listed, but only abnormal results are displayed) Labs Reviewed - No data to display  EKG   Radiology No results found.  Procedures Procedures (including critical care time)  Medications Ordered in UC Medications - No data to display  Initial Impression / Assessment and Plan / UC Course  I have reviewed the triage vital signs and the nursing notes.  Pertinent labs & imaging results that were available during my care of the patient were reviewed by me and considered in my medical decision making (see chart for details).  Suture removal  from laceration of the left lower extremity.  The area is well-healed, no signs of infection are present.  6 simple interrupted sutures removed without difficulty.  Bandage was applied.  Patient was given indications regarding follow-up.  Patient was in agreement with this plan of care and verbalizes understanding.  All questions were answered.  Patient stable for discharge.  Final Clinical Impressions(s) / UC Diagnoses   Final diagnoses:  Visit for suture removal     Discharge Instructions      6 sutures were removed from your left lower leg. Keep the area clean and dry.  Do not pick or disrupt the wound. Continue to monitor for worsening.  Seek care if you develop redness, drainage, swelling, or other concerns. Follow-up as needed.     ED Prescriptions   None    PDMP not reviewed this encounter.   Gilmer Etta PARAS, NP 12/26/23 1302

## 2023-12-26 NOTE — ED Triage Notes (Signed)
 Pt reports to UC for a suture removal from 12/18/23 for a tib/fib lac.  Denies any drainage or fever

## 2023-12-26 NOTE — Discharge Instructions (Signed)
 6 sutures were removed from your left lower leg. Keep the area clean and dry.  Do not pick or disrupt the wound. Continue to monitor for worsening.  Seek care if you develop redness, drainage, swelling, or other concerns. Follow-up as needed.
